# Patient Record
Sex: Female | Born: 1985 | Hispanic: No | Marital: Single | Smoking: Current every day smoker
Health system: Southern US, Community
[De-identification: ages and names within clinical notes are randomized; demographics above are authoritative.]

## PROBLEM LIST (undated history)

## (undated) HISTORY — PX: DILATION AND CURETTAGE OF UTERUS: SHX78

## (undated) HISTORY — PX: LEEP: SHX91

---
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
## 2004-06-20 ENCOUNTER — Emergency Department: Payer: Self-pay | Admitting: Emergency Medicine

## 2005-04-19 ENCOUNTER — Emergency Department: Payer: Self-pay | Admitting: Emergency Medicine

## 2005-11-05 ENCOUNTER — Emergency Department: Payer: Self-pay | Admitting: Internal Medicine

## 2005-12-15 ENCOUNTER — Emergency Department: Payer: Self-pay | Admitting: Emergency Medicine

## 2005-12-16 ENCOUNTER — Ambulatory Visit: Payer: Self-pay | Admitting: Emergency Medicine

## 2005-12-17 ENCOUNTER — Ambulatory Visit: Payer: Self-pay

## 2006-08-16 ENCOUNTER — Ambulatory Visit: Payer: Self-pay

## 2006-10-11 ENCOUNTER — Observation Stay: Payer: Self-pay | Admitting: Obstetrics & Gynecology

## 2006-10-25 ENCOUNTER — Inpatient Hospital Stay: Payer: Self-pay | Admitting: Obstetrics and Gynecology

## 2009-05-28 ENCOUNTER — Emergency Department: Payer: Self-pay | Admitting: Emergency Medicine

## 2012-02-01 ENCOUNTER — Inpatient Hospital Stay: Payer: Self-pay

## 2012-02-02 LAB — HEMATOCRIT: HCT: 35 % (ref 35.0–47.0)

## 2013-08-26 ENCOUNTER — Emergency Department: Payer: Self-pay | Admitting: Emergency Medicine

## 2014-01-28 ENCOUNTER — Emergency Department: Payer: Self-pay | Admitting: Emergency Medicine

## 2014-01-28 LAB — GC/CHLAMYDIA PROBE AMP

## 2014-01-28 LAB — URINALYSIS, COMPLETE
BILIRUBIN, UR: NEGATIVE
Bacteria: NONE SEEN
Glucose,UR: NEGATIVE mg/dL (ref 0–75)
Ketone: NEGATIVE
NITRITE: NEGATIVE
Ph: 6 (ref 4.5–8.0)
Protein: NEGATIVE
RBC,UR: 31 /HPF (ref 0–5)
Specific Gravity: 1.014 (ref 1.003–1.030)
Squamous Epithelial: 9
WBC UR: 187 /HPF (ref 0–5)

## 2014-01-28 LAB — WET PREP, GENITAL

## 2014-08-15 ENCOUNTER — Emergency Department: Payer: Self-pay | Admitting: Emergency Medicine

## 2015-01-27 NOTE — H&P (Signed)
L&D Evaluation:  History Expanded:   HPI 29 yo G4P1021 at 2239 1/7 weeks with EDD of 02/06/12 per LMP. Presented with SROM and contractions and delivered shortly after arrival on unit. PNC at [facility] notable for early entry to care, Normal fetal growth and antenatal testing per protocol for BMI of 40, h/o PTD at 36 weeks for which pt received 17-P injections.    Blood Type O positive    Group B Strep Results (Result >5wks must be treated as unknown) negative    Maternal HIV Negative    Maternal Syphilis Ab Nonreactive    Maternal Varicella Immune    Rubella Results immune    Maternal T-Dap Immune    Patient's Medical History No Chronic Illness    Patient's Surgical History D&C    Medications Pre Natal Vitamins    Allergies NKDA    Social History none    Family History Non-Contributory   Exam:   Vital Signs stable    Chest clear    Heart no murmur/gallop/rubs    Edema no edema    Pelvic no external lesions   Plan:   Comments Precipitous delivery - see delivery note   Electronic Signatures: Vella KohlerBrothers, Evonne Rinks K (CNM)  (Signed 15-May-13 03:21)  Authored: L&D Evaluation   Last Updated: 15-May-13 03:21 by Vella KohlerBrothers, Keanna Tugwell K (CNM)

## 2016-07-04 ENCOUNTER — Emergency Department: Payer: Self-pay

## 2016-07-04 ENCOUNTER — Encounter: Payer: Self-pay | Admitting: Emergency Medicine

## 2016-07-04 ENCOUNTER — Emergency Department
Admission: EM | Admit: 2016-07-04 | Discharge: 2016-07-04 | Disposition: A | Discharge status: Home / Self Care | Payer: Self-pay | Attending: Student in an Organized Health Care Education/Training Program | Admitting: Student in an Organized Health Care Education/Training Program

## 2016-07-04 DIAGNOSIS — F17210 Nicotine dependence, cigarettes, uncomplicated: Secondary | ICD-10-CM | POA: Insufficient documentation

## 2016-07-04 DIAGNOSIS — S93401A Sprain of unspecified ligament of right ankle, initial encounter: Secondary | ICD-10-CM | POA: Insufficient documentation

## 2016-07-04 DIAGNOSIS — Y939 Activity, unspecified: Secondary | ICD-10-CM | POA: Insufficient documentation

## 2016-07-04 DIAGNOSIS — Y999 Unspecified external cause status: Secondary | ICD-10-CM | POA: Insufficient documentation

## 2016-07-04 DIAGNOSIS — X501XXA Overexertion from prolonged static or awkward postures, initial encounter: Secondary | ICD-10-CM | POA: Insufficient documentation

## 2016-07-04 DIAGNOSIS — Y929 Unspecified place or not applicable: Secondary | ICD-10-CM | POA: Insufficient documentation

## 2016-07-04 NOTE — ED Provider Notes (Signed)
[facility] Emergency Department Provider Note ____________________________________________  Time seen: 2034  I have reviewed the triage vital signs and the nursing notes.  HISTORY  Chief Complaint  Ankle Pain  HPI Vickie Frost is a 30 y.o. female presented to the ED for evaluation of pain to the right ankle after she describes twisted and after stepping into a hole in the ground tonight. She describes pain to the lateral aspect of the ankle and of swelling there as well. She has remote history of recurrent ankle sprains. She denies any other injury at this time. She reports pain is increased with attempts to ambulate and weight-bear.  History reviewed. No pertinent past medical history.  There are no active problems to display for this patient.  History reviewed. No pertinent surgical history.  Prior to Admission medications   Not on File    Allergies Review of patient's allergies indicates no known allergies.  No family history on file.  Social History Social History  Substance Use Topics  . Smoking status: Current Every Day Smoker    Packs/day: 1.00    Types: Cigarettes  . Smokeless tobacco: Never Used  . Alcohol use Yes     Comment: occasional   Review of Systems  Constitutional: Negative for fever. Musculoskeletal: Negative for back pain. Right ankle pain as above. Skin: Negative for rash. Neurological: Negative for headaches, focal weakness or numbness. ____________________________________________  PHYSICAL EXAM:  VITAL SIGNS: ED Triage Vitals [07/04/16 1946]  Enc Vitals Group     BP 121/62     Pulse Rate 91     Resp 18     Temp 98.1 F (36.7 C)     Temp Source Oral     SpO2 100 %     Weight 200 lb (90.7 kg)     Height 5\' 4"  (1.626 m)     Head Circumference      Peak Flow      Pain Score 8     Pain Loc      Pain Edu?      Excl. in GC?    Constitutional: Alert and oriented. Well appearing and in no distress. Head:  Normocephalic and atraumatic. Eyes: Conjunctivae are normal. PERR. Normal extraocular movements Cardiovascular: Normal distal pulses. Respiratory: Normal respiratory effort.  Musculoskeletal: Right ankle with obvious lateral soft tissue swelling on exam. Patient is tender to palpation over the ligaments supporting the lateral fibula. She has no significant calf or Achilles tenderness. Negative anterior drawer sign. She is able to telemetry normal range of motion to the ankle. Nontender with normal range of motion in all extremities.  Neurologic:  Normal gait without ataxia. Normal speech and language. No gross focal neurologic deficits are appreciated. Skin:  Skin is warm, dry and intact. No rash noted. No early ecchymosis, bruise is noted to the right ankle. ____________________________________________   RADIOLOGY Right Ankle IMPRESSION: No evidence of fracture or dislocation.  I, Deondria Puryear, Charlesetta Ivory, personally viewed and evaluated these images (plain radiographs) as part of my medical decision making, as well as reviewing the written report by the radiologist. ____________________________________________  PROCEDURES  Ankle Stirrup Splint Crutches ____________________________________________  INITIAL IMPRESSION / ASSESSMENT AND PLAN / ED COURSE  Patient with a Grade II ankle sprain without radiologic evidence of fracture or dislocation. She is fitted with an appropriate stirrup splint and provided with crutches to ambulate. She will follow-up with Dr. Hyacinth Meeker for ongoing symptom management. She'll dose over-the-counter ibuprofen in the interim and  rest, ice, and elevate the foot when seated.  Clinical Course   ____________________________________________  FINAL CLINICAL IMPRESSION(S) / ED DIAGNOSES  Final diagnoses:  Sprain of right ankle, unspecified ligament, initial encounter     Lissa HoardJenise V Bacon Zan Triska, PA-C 07/04/16 2217    Willy EddyPatrick Robinson, MD 07/05/16 0005

## 2016-07-04 NOTE — ED Triage Notes (Signed)
Pt presents to ED with c/o right ankle pain after twisting ankle when stepping in a hole in the ground tonight. Swelling noted to right ankle.

## 2016-07-04 NOTE — Discharge Instructions (Signed)
Your x-ray was negative for a fracture. You should wear the brace for support for the next week. Follow-up with Dr. Hyacinth MeekerMiller for any symptoms that don't improve as expected. Take ibuprofen for pain and inflammation relief.

## 2018-01-20 IMAGING — CR DG ANKLE COMPLETE 3+V*R*
3 series · 3 of 3 positions shown · non-contrast
Comparison: Right ankle radiographs performed 08/26/2013

CLINICAL DATA: Acute onset of right ankle pain and swelling after
twisting injury. Initial encounter.

EXAM:
RIGHT ANKLE - COMPLETE 3+ VIEW

[ankle ap]
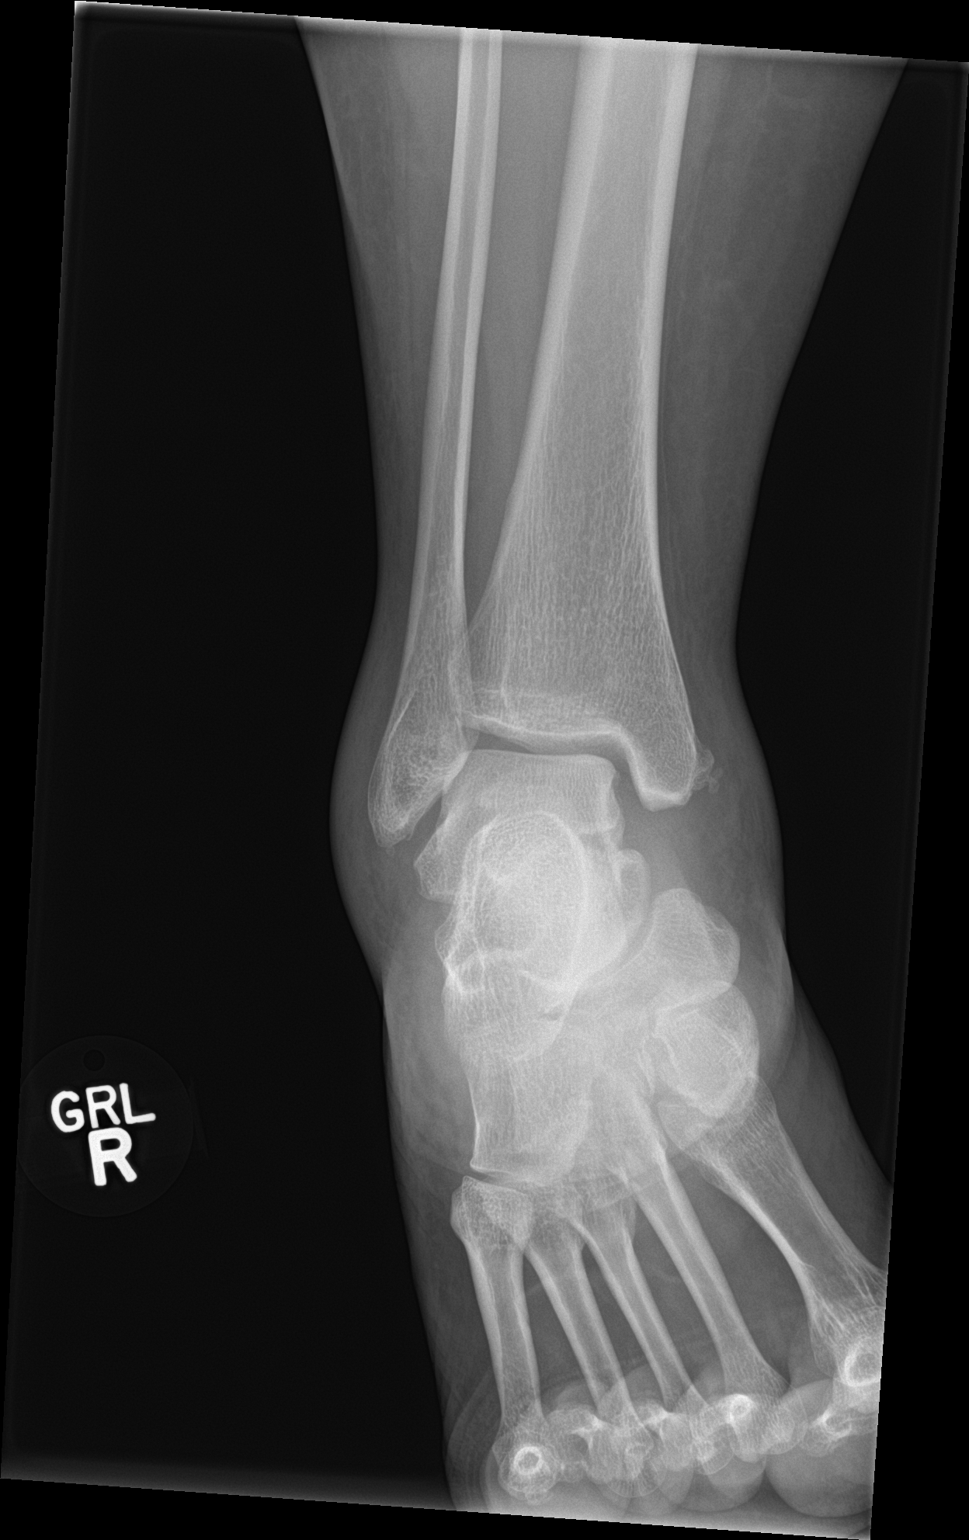

[ankle obl]
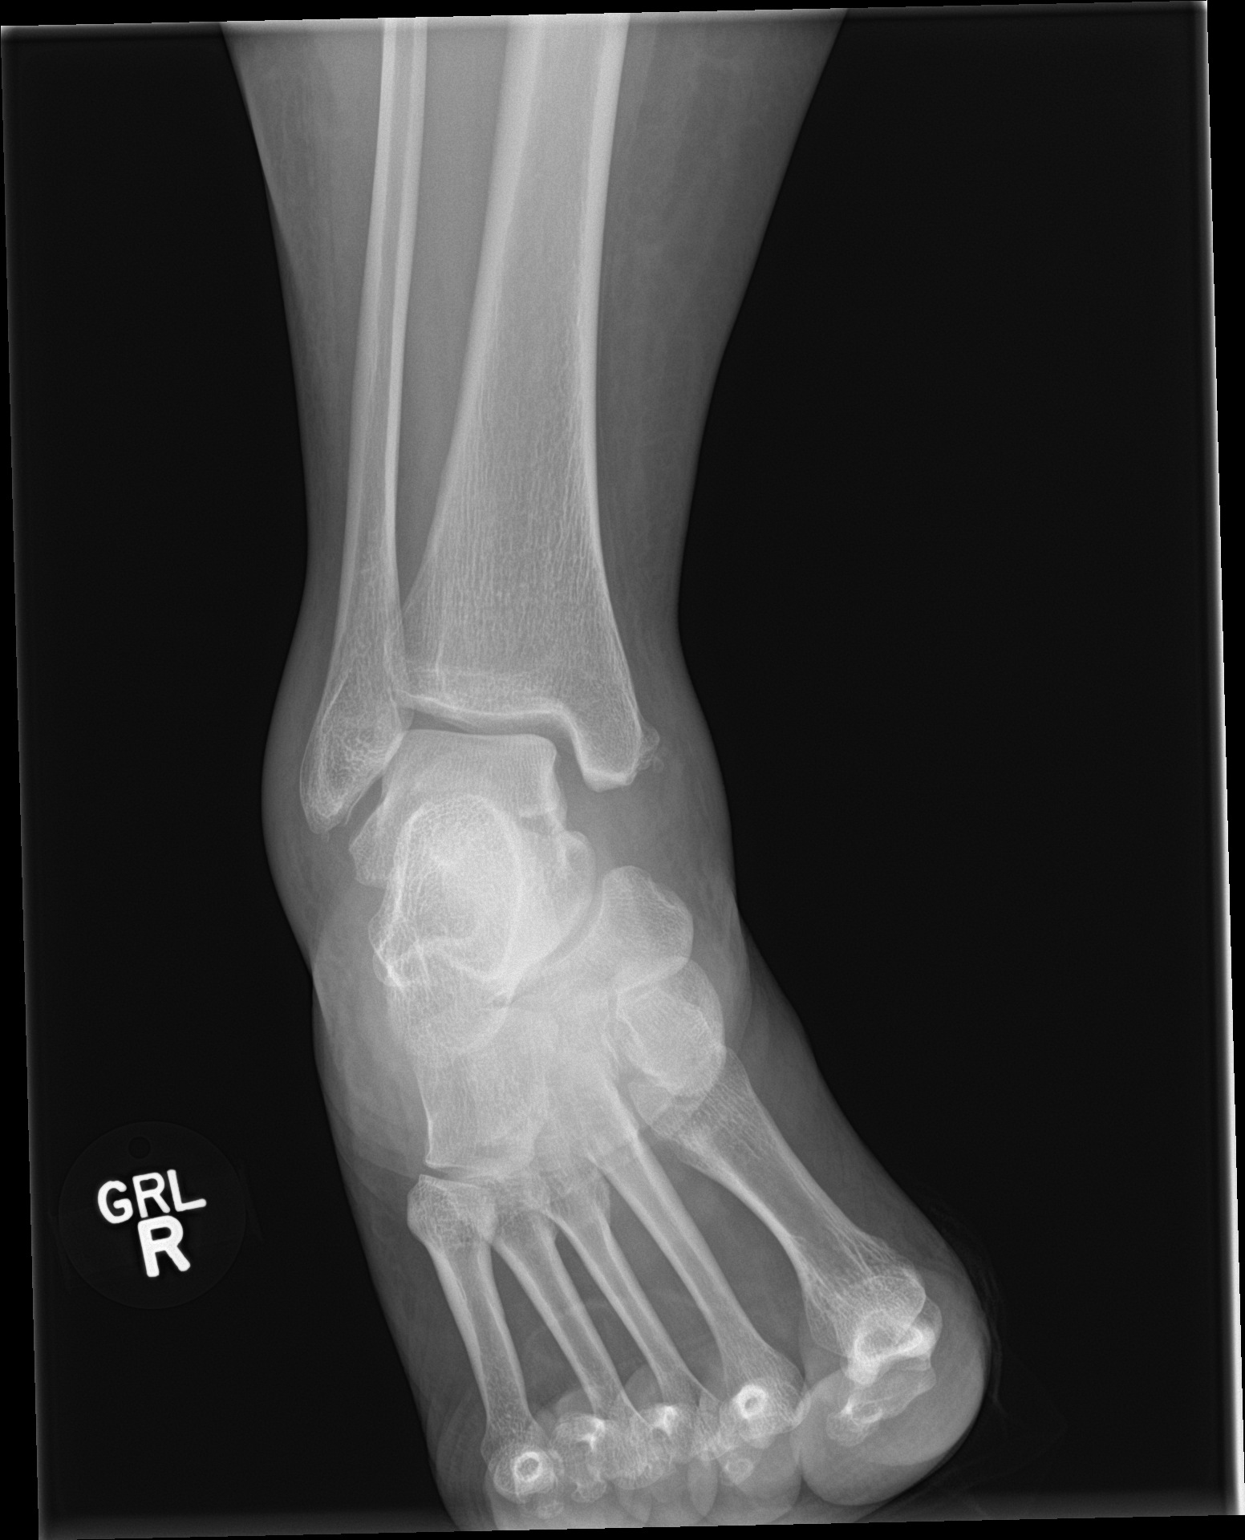

[ankle lat]
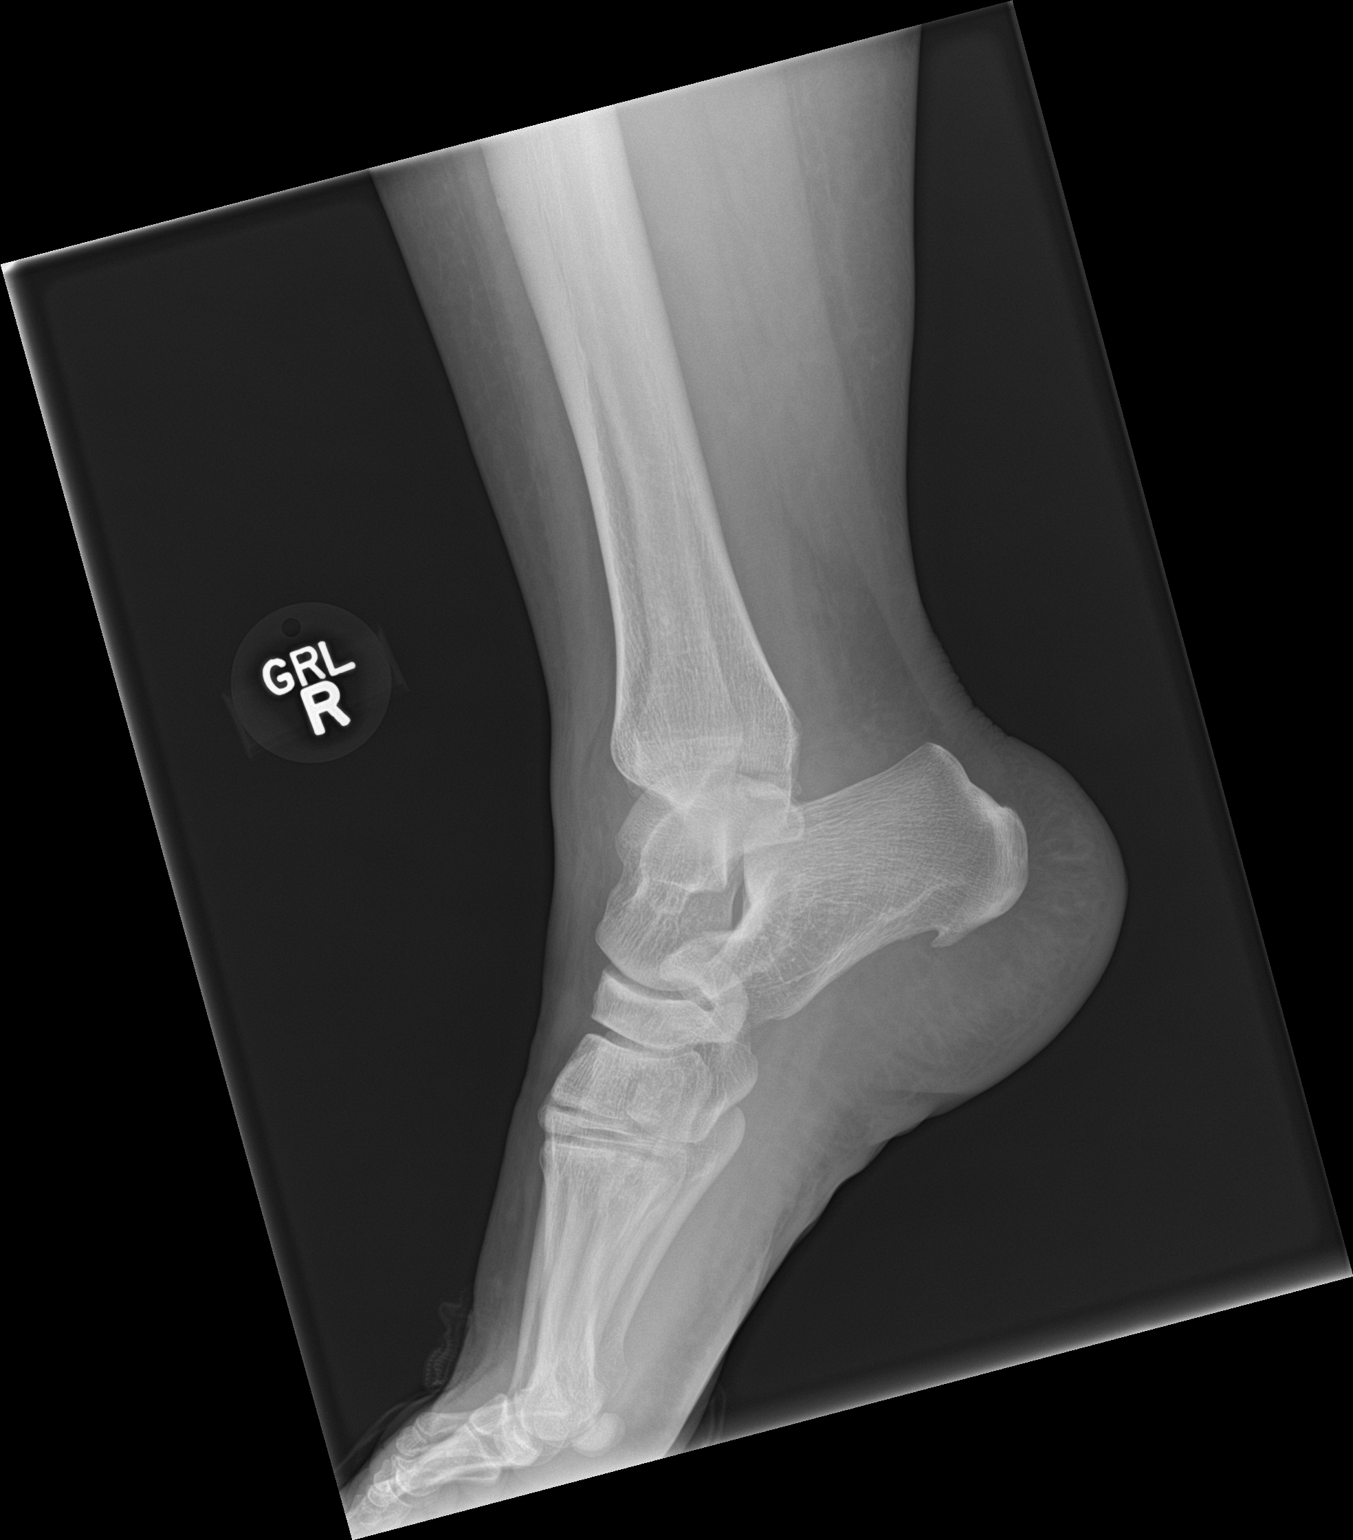

[3 of 3 positions shown; findings below may reference images not displayed]

FINDINGS: There is no evidence of fracture or dislocation. The ankle mortise
is intact; the interosseous space is within normal limits. No talar
tilt or subluxation is seen. A small plantar calcaneal spur is seen.

The joint spaces are preserved. Diffuse soft tissue swelling is
noted about the ankle.
IMPRESSION: No evidence of fracture or dislocation.

## 2018-04-27 ENCOUNTER — Other Ambulatory Visit: Payer: Self-pay

## 2018-04-27 ENCOUNTER — Emergency Department
Admission: EM | Admit: 2018-04-27 | Discharge: 2018-04-27 | Disposition: A | Discharge status: Home / Self Care | Payer: Self-pay | Attending: Emergency Medicine | Admitting: Emergency Medicine

## 2018-04-27 ENCOUNTER — Encounter: Payer: Self-pay | Admitting: Emergency Medicine

## 2018-04-27 DIAGNOSIS — J039 Acute tonsillitis, unspecified: Secondary | ICD-10-CM | POA: Insufficient documentation

## 2018-04-27 DIAGNOSIS — F17210 Nicotine dependence, cigarettes, uncomplicated: Secondary | ICD-10-CM | POA: Insufficient documentation

## 2018-04-27 MED ORDER — IBUPROFEN 600 MG PO TABS: 600.0000 mg | ORAL_TABLET | Freq: Three times a day (TID) | ORAL | 0 refills | Status: AC | PRN

## 2018-04-27 MED ORDER — DIPHENHYDRAMINE HCL 12.5 MG/5ML PO ELIX
12.5000 mg | ORAL_SOLUTION | Freq: Once | ORAL | Status: AC
  Administered 2018-04-27: 12.5 mg via ORAL
  Filled 2018-04-27: qty 5

## 2018-04-27 MED ORDER — KETOROLAC TROMETHAMINE 60 MG/2ML IM SOLN
60.0000 mg | Freq: Once | INTRAMUSCULAR | Status: AC
  Administered 2018-04-27: 60 mg via INTRAMUSCULAR
  Filled 2018-04-27: qty 2

## 2018-04-27 MED ORDER — LIDOCAINE VISCOUS HCL 2 % MT SOLN
15.0000 mL | Freq: Once | OROMUCOSAL | Status: AC
Start: 2018-04-27 — End: 2018-04-27
  Administered 2018-04-27: 15 mL via OROMUCOSAL
  Filled 2018-04-27: qty 15

## 2018-04-27 MED ORDER — AMOXICILLIN 500 MG PO CAPS: 500.0000 mg | ORAL_CAPSULE | Freq: Three times a day (TID) | ORAL | 0 refills | Status: AC

## 2018-04-27 NOTE — ED Triage Notes (Signed)
Says sore throat for 2 days.  Notice white pus pockets today.  Says she feels sick all over and has been unable to eat.  No fever at home.  Throat is red with white exudate esp on left.

## 2018-04-27 NOTE — Discharge Instructions (Addendum)
Take medication as directed.

## 2018-04-27 NOTE — ED Provider Notes (Signed)
[facility] Emergency Department Provider Note   ____________________________________________   First MD Initiated Contact with Patient 04/27/18 1125     (approximate)  I have reviewed the triage vital signs and the nursing notes.   HISTORY  Chief Complaint Sore Throat    HPI Vickie Frost is a 32 y.o. female patient presents with sore throat for 2 days.  Patient states she noticed white pus pockets with a.m. awakening.  Patient also complained of headache and body aches.  Patient had decreased appetite secondary to pain with swallowing.  Patient denies fever.  Patient denies URI signs or symptoms.  No palliative measure for complaint.  Patient rates pain as a 7/10.  Patient described the pain is "sore".  History reviewed. No pertinent past medical history.  There are no active problems to display for this patient.   Past Surgical History:  Procedure Laterality Date  . DILATION AND CURETTAGE OF UTERUS    . LEEP      Prior to Admission medications   Medication Sig Start Date End Date Taking? Authorizing Provider  amoxicillin (AMOXIL) 500 MG capsule Take 1 capsule (500 mg total) by mouth 3 (three) times daily. 04/27/18   Joni ReiningSmith, Ninel Abdella K, PA-C  ibuprofen (ADVIL,MOTRIN) 600 MG tablet Take 1 tablet (600 mg total) by mouth every 8 (eight) hours as needed. 04/27/18   Joni ReiningSmith, Nestor Wieneke K, PA-C    Allergies Patient has no known allergies.  No family history on file.  Social History Social History   Tobacco Use  . Smoking status: Current Every Day Smoker    Packs/day: 1.00    Types: Cigarettes  . Smokeless tobacco: Never Used  Substance Use Topics  . Alcohol use: Yes    Comment: occasional  . Drug use: Not on file    Review of Systems Constitutional: No fever/chills.  Body ache. Eyes: No visual changes. ENT: Sore throat. Cardiovascular: Denies chest pain. Respiratory: Denies shortness of breath. Gastrointestinal: No abdominal pain.  No nausea,  no vomiting.  No diarrhea.  No constipation. Genitourinary: Negative for dysuria. Musculoskeletal: Negative for back pain. Skin: Negative for rash. Neurological: Negative for headaches, focal weakness or numbness.   ____________________________________________   PHYSICAL EXAM:  VITAL SIGNS: ED Triage Vitals  Enc Vitals Group     BP 04/27/18 1128 123/69     Pulse Rate 04/27/18 1128 (!) 116     Resp 04/27/18 1128 16     Temp 04/27/18 1128 98.5 F (36.9 C)     Temp Source 04/27/18 1128 Oral     SpO2 04/27/18 1128 100 %     Weight 04/27/18 1129 240 lb (108.9 kg)     Height 04/27/18 1129 5\' 4"  (1.626 m)     Head Circumference --      Peak Flow --      Pain Score 04/27/18 1129 7     Pain Loc --      Pain Edu? --      Excl. in GC? --     Constitutional: Alert and oriented. Well appearing and in no acute distress. Mouth/Throat: Mucous membranes are moist.  Oropharynx non-erythematous. Neck: No stridor. Hematological/Lymphatic/Immunilogical bilateral cervical lymphadenopathy. Cardiovascular: Tachycardic, regular rhythm. Grossly normal heart sounds.  Good peripheral circulation. Respiratory: Normal respiratory effort.  No retractions. Lungs CTAB. Gastrointestinal: Soft and nontender. No distention. No abdominal bruits. No CVA tenderness. Skin:  Skin is warm, dry and intact. No rash noted. Psychiatric: Mood and affect are normal. Speech and behavior  are normal.  ____________________________________________   LABS (all labs ordered are listed, but only abnormal results are displayed)  Labs Reviewed - No data to display ____________________________________________  EKG   ____________________________________________  RADIOLOGY  ED MD interpretation:    Official radiology report(s): No results found.  ____________________________________________   PROCEDURES  Procedure(s) performed:   Procedures  Critical Care performed:  No  ____________________________________________   INITIAL IMPRESSION / ASSESSMENT AND PLAN / ED COURSE  As part of my medical decision making, I reviewed the following data within the electronic MEDICAL RECORD NUMBER    Sore throat secondary to tonsillitis.  Patient given discharge care instructions and a work note.  Patient advised take medication as directed.  Patient [facility].      ____________________________________________   FINAL CLINICAL IMPRESSION(S) / ED DIAGNOSES  Final diagnoses:  Tonsillitis     ED Discharge Orders         Ordered    amoxicillin (AMOXIL) 500 MG capsule  3 times daily     04/27/18 1142    ibuprofen (ADVIL,MOTRIN) 600 MG tablet  Every 8 hours PRN     04/27/18 1142           Note:  This document was prepared using Dragon voice recognition software and may include unintentional dictation errors.    Joni Reining, PA-C 04/27/18 1146    Minna Antis, MD 04/27/18 1524
